# Patient Record
Sex: Female | Born: 1975 | Race: White | Hispanic: No | Marital: Married | Smoking: Never smoker
Health system: Southern US, Community
[De-identification: ages and names within clinical notes are randomized; demographics above are authoritative.]

## PROBLEM LIST (undated history)

## (undated) DIAGNOSIS — K219 Gastro-esophageal reflux disease without esophagitis: Secondary | ICD-10-CM

## (undated) DIAGNOSIS — R55 Syncope and collapse: Secondary | ICD-10-CM

## (undated) DIAGNOSIS — I499 Cardiac arrhythmia, unspecified: Secondary | ICD-10-CM

---

## 2000-02-20 ENCOUNTER — Other Ambulatory Visit: Admission: RE | Admit: 2000-02-20 | Discharge: 2000-02-20 | Payer: Self-pay | Admitting: Obstetrics and Gynecology

## 2001-04-02 ENCOUNTER — Ambulatory Visit (HOSPITAL_COMMUNITY): Admission: RE | Admit: 2001-04-02 | Discharge: 2001-04-02 | Payer: Self-pay | Admitting: Cardiology

## 2001-04-02 ENCOUNTER — Encounter: Payer: Self-pay | Admitting: Cardiology

## 2001-04-14 ENCOUNTER — Ambulatory Visit (HOSPITAL_COMMUNITY): Admission: RE | Admit: 2001-04-14 | Discharge: 2001-04-14 | Payer: Self-pay | Admitting: Cardiology

## 2004-12-16 ENCOUNTER — Other Ambulatory Visit: Admission: RE | Admit: 2004-12-16 | Discharge: 2004-12-16 | Payer: Self-pay | Admitting: Obstetrics and Gynecology

## 2005-12-29 ENCOUNTER — Other Ambulatory Visit: Admission: RE | Admit: 2005-12-29 | Discharge: 2005-12-29 | Payer: Self-pay | Admitting: Obstetrics and Gynecology

## 2007-01-05 ENCOUNTER — Other Ambulatory Visit: Admission: RE | Admit: 2007-01-05 | Discharge: 2007-01-05 | Payer: Self-pay | Admitting: Obstetrics and Gynecology

## 2007-03-16 ENCOUNTER — Other Ambulatory Visit: Admission: RE | Admit: 2007-03-16 | Discharge: 2007-03-16 | Payer: Self-pay | Admitting: Obstetrics and Gynecology

## 2007-06-15 ENCOUNTER — Other Ambulatory Visit: Admission: RE | Admit: 2007-06-15 | Discharge: 2007-06-15 | Payer: Self-pay | Admitting: Obstetrics and Gynecology

## 2007-09-14 ENCOUNTER — Other Ambulatory Visit: Admission: RE | Admit: 2007-09-14 | Discharge: 2007-09-14 | Payer: Self-pay | Admitting: Obstetrics and Gynecology

## 2008-12-08 DIAGNOSIS — R55 Syncope and collapse: Secondary | ICD-10-CM

## 2008-12-08 HISTORY — DX: Syncope and collapse: R55

## 2009-02-16 ENCOUNTER — Encounter (INDEPENDENT_AMBULATORY_CARE_PROVIDER_SITE_OTHER): Payer: Self-pay | Admitting: Emergency Medicine

## 2009-02-16 ENCOUNTER — Emergency Department (HOSPITAL_COMMUNITY): Admission: EM | Admit: 2009-02-16 | Discharge: 2009-02-16 | Payer: Self-pay | Admitting: *Deleted

## 2009-02-16 ENCOUNTER — Ambulatory Visit: Payer: Self-pay | Admitting: Surgery

## 2009-02-21 ENCOUNTER — Ambulatory Visit: Payer: Self-pay | Admitting: Internal Medicine

## 2009-02-21 DIAGNOSIS — R0789 Other chest pain: Secondary | ICD-10-CM | POA: Insufficient documentation

## 2009-03-22 ENCOUNTER — Encounter: Admission: RE | Admit: 2009-03-22 | Discharge: 2009-03-22 | Payer: Self-pay | Admitting: Obstetrics and Gynecology

## 2009-05-23 ENCOUNTER — Inpatient Hospital Stay (HOSPITAL_COMMUNITY): Admission: AD | Admit: 2009-05-23 | Discharge: 2009-05-23 | Payer: Self-pay | Admitting: Obstetrics and Gynecology

## 2009-05-23 ENCOUNTER — Inpatient Hospital Stay (HOSPITAL_COMMUNITY): Admission: AD | Admit: 2009-05-23 | Discharge: 2009-05-24 | Payer: Self-pay | Admitting: Obstetrics and Gynecology

## 2009-05-24 ENCOUNTER — Inpatient Hospital Stay (HOSPITAL_COMMUNITY): Admission: AD | Admit: 2009-05-24 | Discharge: 2009-05-28 | Payer: Self-pay | Admitting: Obstetrics and Gynecology

## 2009-05-25 ENCOUNTER — Encounter (INDEPENDENT_AMBULATORY_CARE_PROVIDER_SITE_OTHER): Payer: Self-pay | Admitting: Obstetrics and Gynecology

## 2009-10-29 ENCOUNTER — Encounter: Admission: RE | Admit: 2009-10-29 | Discharge: 2009-10-29 | Payer: Self-pay | Admitting: Obstetrics and Gynecology

## 2010-01-25 IMAGING — CR DG CHEST 2V
2 series · 2 of 2 positions shown · non-contrast
Comparison: None

CLINICAL DATA: Shortness of breath

CHEST - 2 VIEW
The patient is 24 weeks pregnant.  The patient abdomen was double
shielded during examination.

[w chest pa]
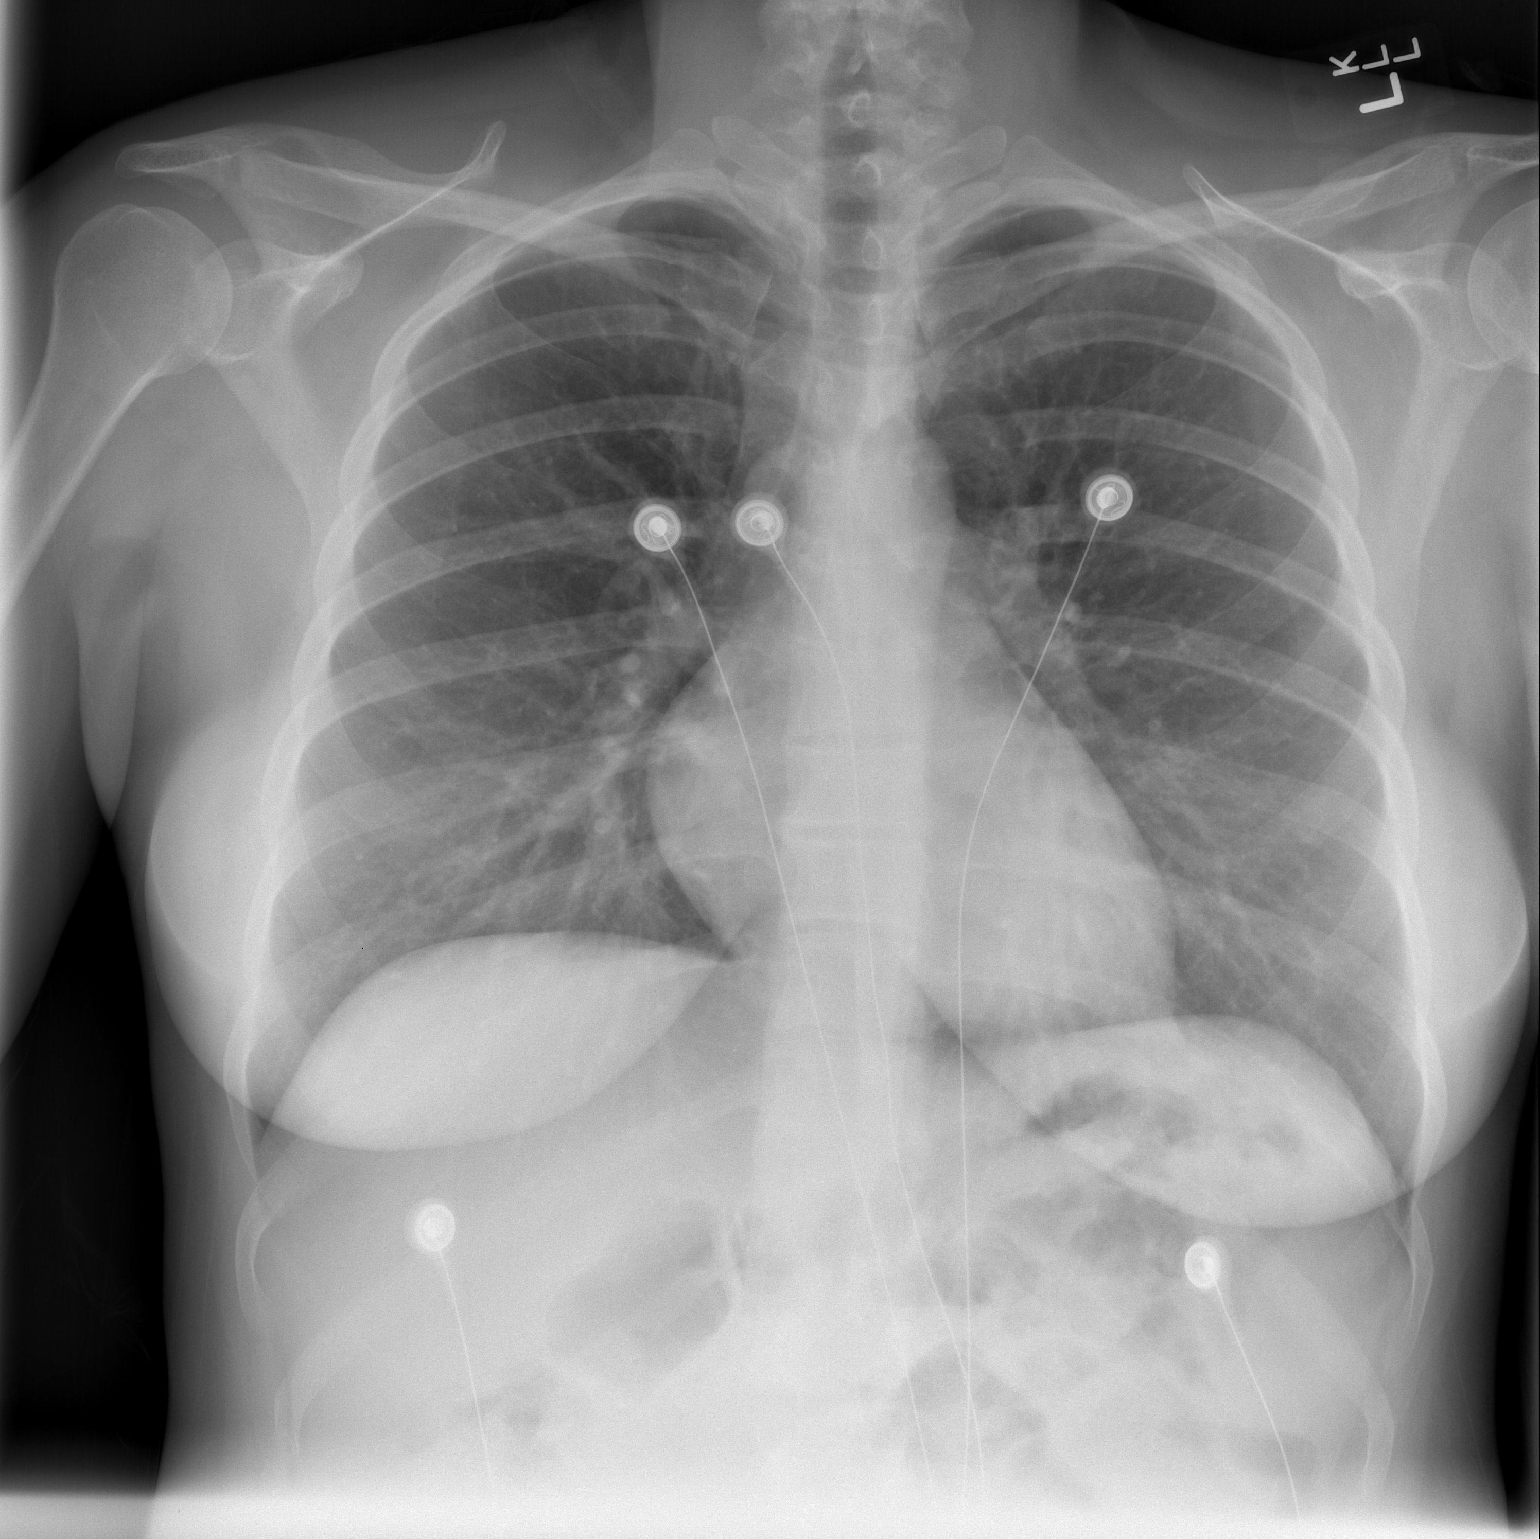

[w chest lat]
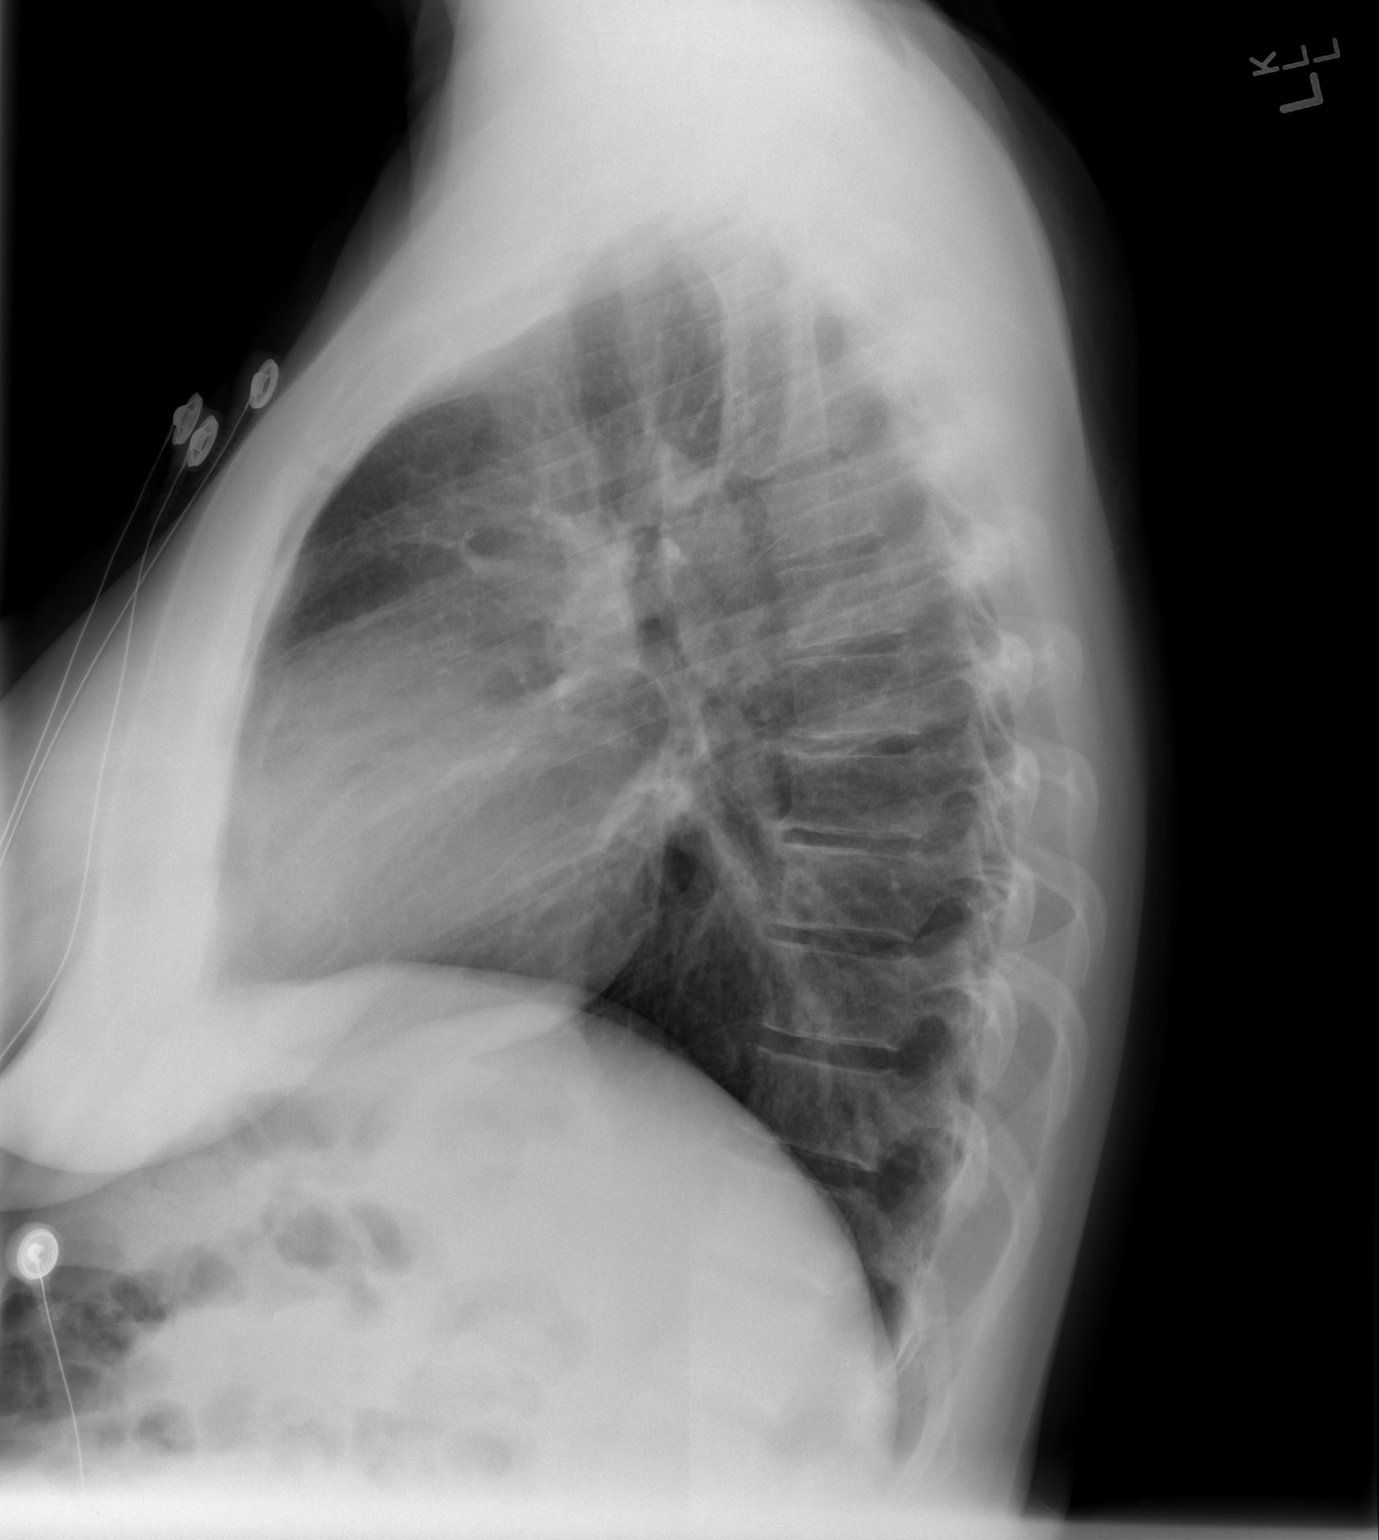

[2 of 2 positions shown; findings below may reference images not displayed]

FINDINGS: Cardiomediastinal silhouette is unremarkable.  No acute
infiltrate or pleural effusion.  No pulmonary edema.  Bony thorax
is unremarkable. No pneumothorax.
IMPRESSION: No active disease.

## 2010-03-27 ENCOUNTER — Encounter: Admission: RE | Admit: 2010-03-27 | Discharge: 2010-03-27 | Payer: Self-pay | Admitting: Obstetrics and Gynecology

## 2011-01-01 ENCOUNTER — Other Ambulatory Visit: Payer: Self-pay | Admitting: Obstetrics and Gynecology

## 2011-02-26 ENCOUNTER — Other Ambulatory Visit: Payer: Self-pay | Admitting: Obstetrics and Gynecology

## 2011-02-26 DIAGNOSIS — Z09 Encounter for follow-up examination after completed treatment for conditions other than malignant neoplasm: Secondary | ICD-10-CM

## 2011-03-17 LAB — CBC
HCT: 29.5 % — ABNORMAL LOW (ref 36.0–46.0)
HCT: 38.3 % (ref 36.0–46.0)
Hemoglobin: 13.2 g/dL (ref 12.0–15.0)
MCHC: 34.5 g/dL (ref 30.0–36.0)
MCHC: 34.6 g/dL (ref 30.0–36.0)
MCV: 87.5 fL (ref 78.0–100.0)
MCV: 88.3 fL (ref 78.0–100.0)
Platelets: 149 10*3/uL — ABNORMAL LOW (ref 150–400)
Platelets: 156 10*3/uL (ref 150–400)
RDW: 14.7 % (ref 11.5–15.5)
RDW: 15.1 % (ref 11.5–15.5)
RDW: 15.1 % (ref 11.5–15.5)
WBC: 9.8 10*3/uL (ref 4.0–10.5)

## 2011-03-17 LAB — CREATININE, SERUM: GFR calc Af Amer: >60 mL/min (ref >60)

## 2011-03-20 LAB — CBC
MCV: 86.7 fL (ref 78.0–100.0)
Platelets: 263 10*3/uL (ref 150–400)
RDW: 13.6 % (ref 11.5–15.5)
WBC: 8.7 10*3/uL (ref 4.0–10.5)

## 2011-03-20 LAB — DIFFERENTIAL
Basophils Absolute: 0.1 10*3/uL (ref 0.0–0.1)
Eosinophils Absolute: 0.1 10*3/uL (ref 0.0–0.7)
Lymphocytes Relative: 14 % (ref 12–46)
Lymphs Abs: 1.2 10*3/uL (ref 0.7–4.0)
Neutrophils Relative %: 79 % — ABNORMAL HIGH (ref 43–77)

## 2011-03-20 LAB — BASIC METABOLIC PANEL
BUN: 6 mg/dL (ref 6–23)
Creatinine, Ser: 0.53 mg/dL (ref 0.4–1.2)
GFR calc non Af Amer: >60 mL/min (ref >60)

## 2011-03-20 LAB — D-DIMER, QUANTITATIVE: D-Dimer, Quant: 0.73 ug/mL-FEU — ABNORMAL HIGH (ref 0.00–0.48)

## 2011-03-20 LAB — PROTIME-INR: Prothrombin Time: 13.2 seconds (ref 11.6–15.2)

## 2011-04-03 ENCOUNTER — Ambulatory Visit
Admission: RE | Admit: 2011-04-03 | Discharge: 2011-04-03 | Disposition: A | Discharge status: Home / Self Care | Payer: BC Managed Care – PPO | Source: Ambulatory Visit | Attending: Obstetrics and Gynecology | Admitting: Obstetrics and Gynecology

## 2011-04-03 DIAGNOSIS — Z09 Encounter for follow-up examination after completed treatment for conditions other than malignant neoplasm: Secondary | ICD-10-CM

## 2011-04-22 NOTE — Op Note (Signed)
NAMESERENA, Olivia Fry          ACCOUNT NO.:  [id number]   MEDICAL RECORD NO.:  [id number]          PATIENT TYPE:  INP   LOCATION:  9112                          FACILITY:  [facility]   PHYSICIAN:  Carrington Clamp, M.D. DATE OF BIRTH:  11/05/1976   DATE OF PROCEDURE:  05/25/2009  DATE OF DISCHARGE:                               OPERATIVE REPORT   PREOPERATIVE DIAGNOSIS:  Arrest of active phase during term labor with  nonreassuring fetal heart tones, remote from vaginal delivery.   POSTOPERATIVE DIAGNOSIS:  Arrest of active phase during term labor with  nonreassuring fetal heart tones, remote from vaginal delivery.   PROCEDURE:  Primary low-transverse cesarean section.   SURGEON:  Carrington Clamp, MD   ASSISTANT:  None.   ANESTHESIA:  Epidural.   FINDINGS:  Female infant vertex presentation, Apgars 7 and 9.  Normal  tubes, ovaries and uterus were seen.  Baby's weight was 9 pounds and the  cord gas was 7.33.   SPECIMENS:  Placenta to Pathology.   BLOOD LOSS:  800 mL.   IV FLUIDS:  3000 mL.   URINE OUTPUT:  125 mL.   COMPLICATIONS:  None.   MEDICATIONS:  Ancef and Pitocin.   Counts were correct x3.   REASON FOR OPERATION:  The patient remained at 9 complete -2 for 2-1/2  hours despite Pitocin augmentation.  The patient had started with  chorioamnionitis earlier in the evening.  The labor had been protracted  before arrest at 9 cm.  Prior to the operation, fetal heart tones now  had mild-to-moderate variables with each contraction.  All risks,  benefits, and alternatives of the C-section had been discussed with the  patient.  The patient agreed to proceed.   TECHNIQUE:  After adequate epidural anesthesia was achieved, the patient  was prepped and draped in the usual sterile fashion in dorsal supine  position with leftward tilt.  Pfannenstiel skin incision was made with a  scalpel and carried down to the fascia with Bovie cautery.  Fascia was  incised in the midline with  the scalpel and carried in a transverse  curvilinear manner with a Mayo scissors.  The fascia was reflected  superiorly and inferiorly from the rectus muscle.  The rectus muscles  were split in the midline.  The bowel free portion of peritoneum was  entered into with bluntly and the peritoneum was incised in a superior  and inferior manner with good visualization of bowel and the bladder.   The Alexis instrument was placed in the vesicouterine fascia, tented up  and incised in a transverse curvilinear manner.  The bladder was  retracted inferiorly with the development of the bladder flap bluntly.  A 2-cm incision was made in the upper portion of the lower uterine  segment until clear fluid was noted.  The uterine incision was extended  in a transverse curvilinear manner.  The baby was identified in the  vertex presentation, delivered without complication.  The cords were  clamped and cut, and the baby was handed to the awaiting neonatologist.   The placenta was delivered manually and the uterus was cleared of all  debris with wet laps.  The uterine incision was closed with a running  lock stitch of 0 Monocryl.  An imbricating layer of 0 Monocryl was  performed as well.  Once hemostasis was achieved, irrigation was  performed and all instruments were withdrawn from the abdomen after the  ovaries and tubes have been noted to be normal.   The peritoneum was then closed with a running stitch of 2-0 Vicryl.  This incorporated the rectus muscles as a separate layer.  The fascia  was closed with a running stitch of 0 Vicryl.  Subcutaneous tissue was  rendered hemostatic with Bovie cautery irrigation.  The subcutaneous  tissue was closed with interrupted stitches of 2-0 plain gut.  The skin  was closed with staples.  The patient tolerated the procedure well and  was returned to recovery room in stable condition.      Carrington Clamp, M.D.  Electronically Signed     MH/MEDQ  D:   05/25/2009  T:  05/25/2009  Job:  [id number]

## 2011-04-25 NOTE — Discharge Summary (Signed)
Olivia Fry, BLASSINGAME          ACCOUNT NO.:  [id number]   MEDICAL RECORD NO.:  [id number]          PATIENT TYPE:  INP   LOCATION:  9112                          FACILITY:  [facility]   PHYSICIAN:  Kendra H. Tenny Craw, MD     DATE OF BIRTH:  10-Jan-1976   DATE OF ADMISSION:  05/24/2009  DATE OF DISCHARGE:  05/28/2009                               DISCHARGE SUMMARY   FINAL DIAGNOSES:  Intrauterine pregnancy at 37-5/[redacted] weeks gestation,  active labor, arrest of the active phase of labor, nonreassuring fetal  heart tones, remote from vaginal delivery.   PROCEDURE:  Primary low-transverse cesarean section.   SURGEON:  Carrington Clamp, MD   COMPLICATIONS:  None.   This 35 year old, G1, P0, presents at 37-5/[redacted] weeks gestation in active  labor.  The patient's antepartum course up to this point had been  uncomplicated.  The patient did have some supraventricular tachycardia  noted during this pregnancy.  The patient also had some polyhydramnios  noted.  The patient's antepartum testing have all been reassuring.  However, she had a negative group B strep culture obtained in 35 weeks.  The patient dilated to about 9 cm of dilation.  The patient started to  develop a temperature early in the evening.  She did not dilate past 9  cm about a -2 station despite 2-1/2 hours of Pitocin augmentation.  Because of this arrest during the active phase of labor and some fetal  heart tone changes with these contractions, discussion was held with the  patient.  Decision was made to proceed with the cesarean section.  The  patient was taken to the operating room on May 25, 2009 by Dr. Carrington Clamp, where a primary low-transverse cesarean section was performed  with the delivery of a 9 pound female infant with Apgars of 7 and 9.  There was a cord pH of 7.33.  Delivery went without complications.  The  patient's postoperative course was benign without any significant fever.  She did want her little boy circumcised  prior to discharge which was  performed.  The patient was felt ready for discharge by postoperative  day #3.  She was sent home on a regular diet, told to decrease  activities, told to continue her prenatal vitamins, was given a  prescription for pain medication to use as needed, the name of the  medication was not in the chart.  She is to follow up in our office in 4-  6 weeks.  Instructions and precautions were reviewed with the patient.   LABORATORY ON DISCHARGE:  The patient had a hemoglobin of 10.1, white  blood cell count of 7.3, and platelets of 156,000.      [name], P.A.-C.      Freddrick March. Tenny Craw, MD  Electronically Signed   MB/MEDQ  D:  06/08/2009  T:  06/09/2009  Job:  [id number]

## 2011-11-14 ENCOUNTER — Other Ambulatory Visit: Payer: Self-pay | Admitting: Obstetrics and Gynecology

## 2011-12-12 LAB — OB RESULTS CONSOLE ANTIBODY SCREEN: Antibody Screen: NEGATIVE

## 2011-12-12 LAB — OB RESULTS CONSOLE ABO/RH: RH Type: POSITIVE

## 2011-12-12 LAB — OB RESULTS CONSOLE RPR: RPR: NONREACTIVE

## 2011-12-12 LAB — OB RESULTS CONSOLE RUBELLA ANTIBODY, IGM: Rubella: IMMUNE

## 2011-12-12 LAB — OB RESULTS CONSOLE GC/CHLAMYDIA: Gonorrhea: NEGATIVE

## 2012-03-11 IMAGING — US US BREAST R
1 series · 4 of 4 positions shown · non-contrast
Comparison: 03/22/2009, 10/29/2009 and 03/27/2010

CLINICAL DATA: Patient presents for a diagnostic right breast
ultrasound as follow-up of a known mass at the 7 o'clock position.
This mass is palpable as the patient states it has not changed over
the past 2 years.

RIGHT BREAST ULTRASOUND

[Series 1: us breast right · 4 of 4 slices shown]
[im 1/4]
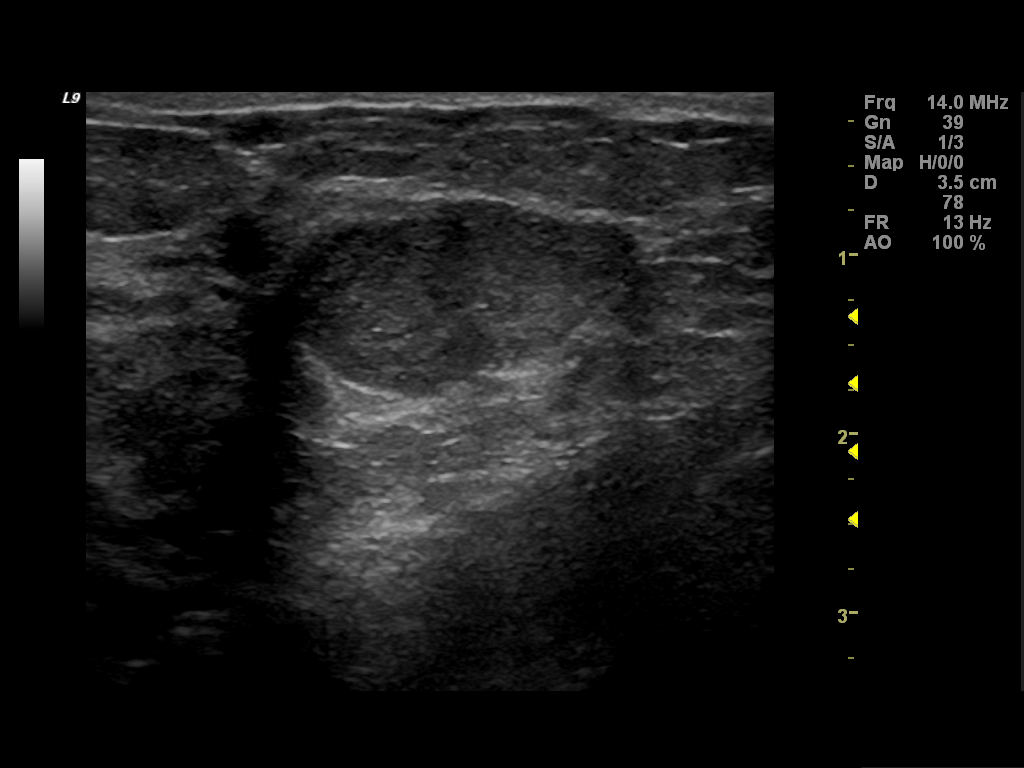
[im 2/4]
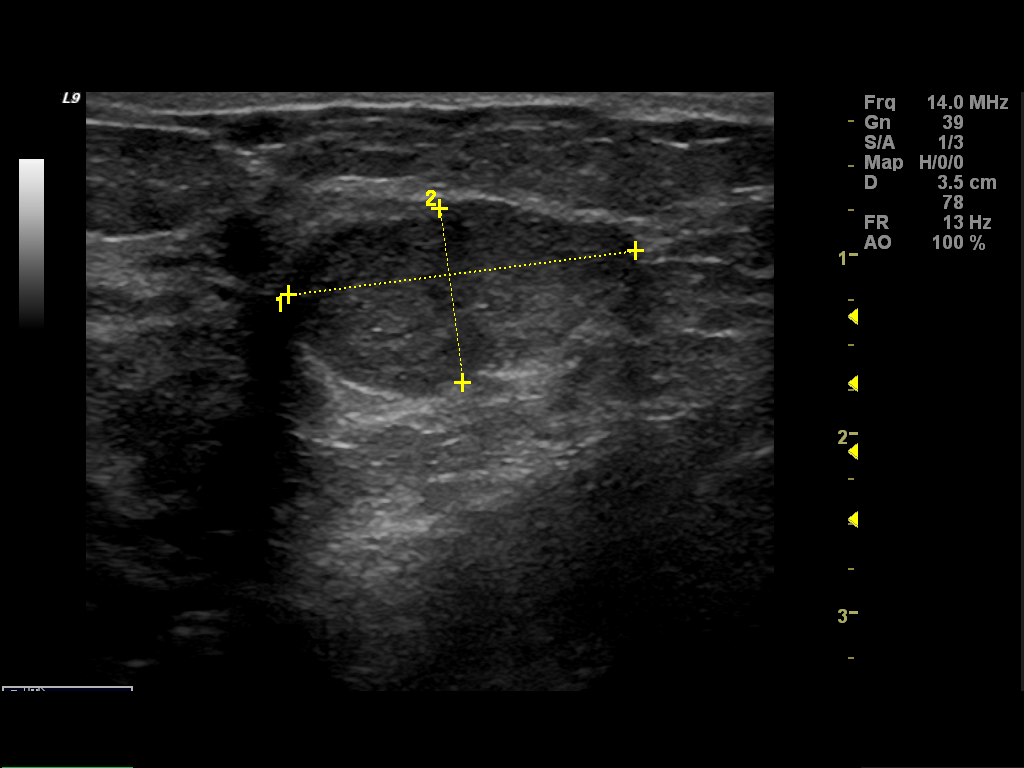
[im 3/4]
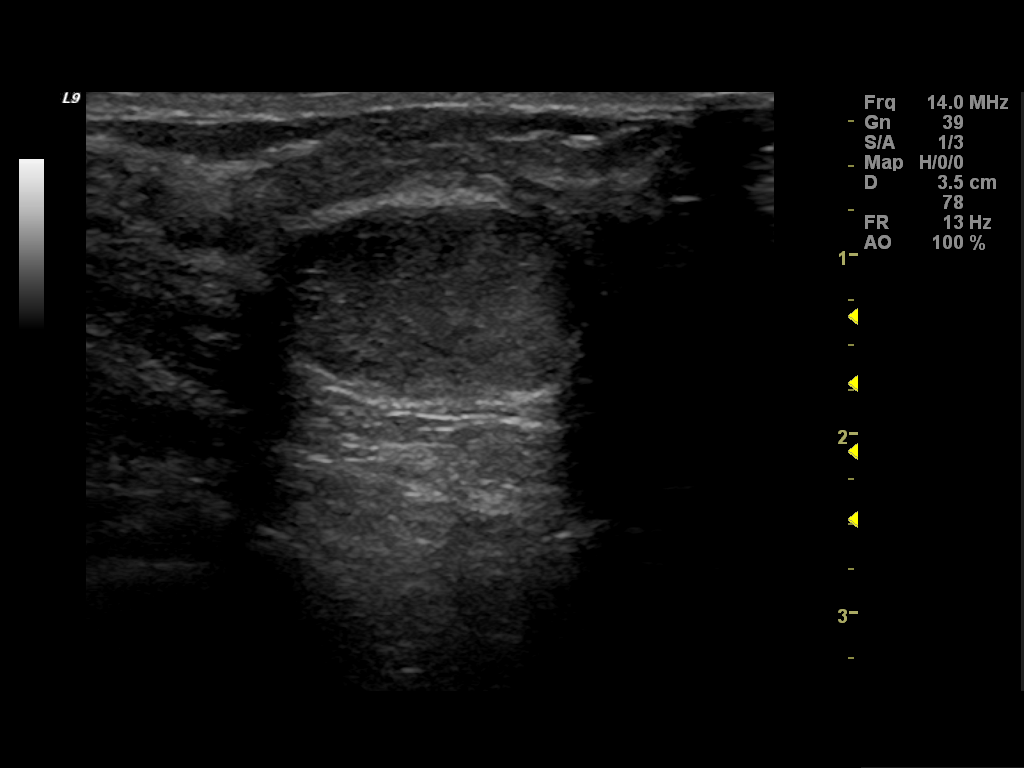
[im 4/4]
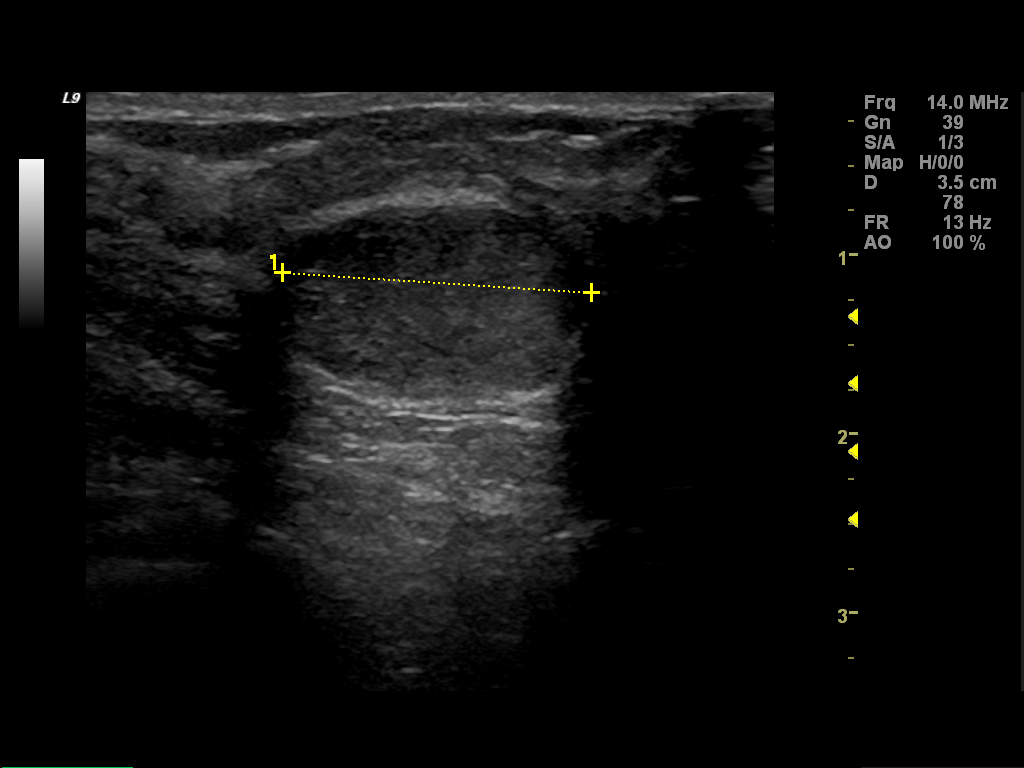

[4 of 4 positions shown; findings below may reference images not displayed]

FINDINGS: Ultrasound is performed, showing evidence of patient's
known well-defined ovoid hypoechoic mass at the 7 o'clock position
of the right breast 4 cm from the nipple.  This measures 1 x 1.7 x
2 cm and is slightly smaller compared to the prior exams.  This
likely represents a fibroadenoma.
IMPRESSION: Stable 2 cm mass at the 7 o'clock position of the right breast 4 cm
from the nipple likely a fibroadenoma.  This has been stable for 2
years.

Recommendations:  Recommend beginning annual screening mammographic
follow-up at age 42.

BI-RADS CATEGORY 2:  Benign finding(s).

## 2012-05-11 ENCOUNTER — Other Ambulatory Visit (HOSPITAL_COMMUNITY): Payer: Self-pay | Admitting: Obstetrics and Gynecology

## 2012-05-11 DIAGNOSIS — O26849 Uterine size-date discrepancy, unspecified trimester: Secondary | ICD-10-CM

## 2012-05-20 ENCOUNTER — Encounter (HOSPITAL_COMMUNITY): Payer: Self-pay

## 2012-05-20 ENCOUNTER — Ambulatory Visit (HOSPITAL_COMMUNITY)
Admission: RE | Admit: 2012-05-20 | Discharge: 2012-05-20 | Disposition: A | Discharge status: Home / Self Care | Payer: BC Managed Care – PPO | Source: Ambulatory Visit | Attending: Obstetrics and Gynecology | Admitting: Obstetrics and Gynecology

## 2012-05-20 DIAGNOSIS — Z3689 Encounter for other specified antenatal screening: Secondary | ICD-10-CM | POA: Insufficient documentation

## 2012-05-20 DIAGNOSIS — O34219 Maternal care for unspecified type scar from previous cesarean delivery: Secondary | ICD-10-CM | POA: Insufficient documentation

## 2012-05-20 DIAGNOSIS — O3660X0 Maternal care for excessive fetal growth, unspecified trimester, not applicable or unspecified: Secondary | ICD-10-CM | POA: Insufficient documentation

## 2012-05-20 DIAGNOSIS — O26849 Uterine size-date discrepancy, unspecified trimester: Secondary | ICD-10-CM

## 2012-05-21 LAB — OB RESULTS CONSOLE GBS: GBS: NEGATIVE

## 2012-06-02 ENCOUNTER — Encounter (HOSPITAL_COMMUNITY): Payer: Self-pay | Admitting: Pharmacy Technician

## 2012-06-08 ENCOUNTER — Encounter (HOSPITAL_COMMUNITY): Payer: Self-pay

## 2012-06-09 ENCOUNTER — Encounter (HOSPITAL_COMMUNITY): Payer: Self-pay

## 2012-06-09 ENCOUNTER — Encounter (HOSPITAL_COMMUNITY)
Admission: RE | Admit: 2012-06-09 | Discharge: 2012-06-09 | Disposition: A | Discharge status: Home / Self Care | Payer: BC Managed Care – PPO | Source: Ambulatory Visit | Attending: Obstetrics and Gynecology | Admitting: Obstetrics and Gynecology

## 2012-06-09 HISTORY — DX: Cardiac arrhythmia, unspecified: I49.9

## 2012-06-09 HISTORY — DX: Syncope and collapse: R55

## 2012-06-09 HISTORY — DX: Gastro-esophageal reflux disease without esophagitis: K21.9

## 2012-06-09 LAB — TYPE AND SCREEN: Antibody Screen: NEGATIVE

## 2012-06-09 LAB — ABO/RH: ABO/RH(D): O POS

## 2012-06-09 LAB — CBC
HCT: 36.4 % (ref 36.0–46.0)
Hemoglobin: 12.4 g/dL (ref 12.0–15.0)
MCHC: 34.1 g/dL (ref 30.0–36.0)
RBC: 4.33 MIL/uL (ref 3.87–5.11)

## 2012-06-09 NOTE — Pre-Procedure Instructions (Signed)
Dr. F.Jackson notified via email of pt being high risk for PPH. 

## 2012-06-09 NOTE — Patient Instructions (Addendum)
YOUR PROCEDURE IS SCHEDULED ON:06/17/12  ENTER THROUGH THE MAIN ENTRANCE OF [facility] AT:6am  USE DESK PHONE AND DIAL [id number] TO INFORM us OF YOUR ARRIVAL  CALL [phone] IF YOU HAVE ANY QUESTIONS OR PROBLEMS PRIOR TO YOUR ARRIVAL.  REMEMBER: DO NOT EAT OR DRINK AFTER MIDNIGHT : Wed   SPECIAL INSTRUCTIONS:none   YOU MAY BRUSH YOUR TEETH THE MORNING OF SURGERY   TAKE THESE MEDICINES THE DAY OF SURGERY WITH SIP OF WATER: Zantac   DO NOT WEAR JEWELRY, EYE MAKEUP, LIPSTICK OR DARK FINGERNAIL POLISH DO NOT WEAR LOTIONS  DO NOT SHAVE FOR 48 HOURS PRIOR TO SURGERY

## 2012-06-16 ENCOUNTER — Encounter (HOSPITAL_COMMUNITY): Payer: Self-pay | Admitting: Anesthesiology

## 2012-06-16 ENCOUNTER — Encounter (HOSPITAL_COMMUNITY)
Admission: AD | Disposition: A | Discharge status: Home / Self Care | Payer: Self-pay | Source: Ambulatory Visit | Attending: Obstetrics & Gynecology

## 2012-06-16 ENCOUNTER — Encounter (HOSPITAL_COMMUNITY): Payer: Self-pay | Admitting: *Deleted

## 2012-06-16 ENCOUNTER — Inpatient Hospital Stay (HOSPITAL_COMMUNITY)
Admission: AD | Admit: 2012-06-16 | Discharge: 2012-06-18 | DRG: 371 | Disposition: A | Discharge status: Home / Self Care | Payer: BC Managed Care – PPO | Source: Ambulatory Visit | Attending: Obstetrics & Gynecology | Admitting: Obstetrics & Gynecology

## 2012-06-16 ENCOUNTER — Inpatient Hospital Stay (HOSPITAL_COMMUNITY): Payer: BC Managed Care – PPO | Admitting: Anesthesiology

## 2012-06-16 DIAGNOSIS — O34219 Maternal care for unspecified type scar from previous cesarean delivery: Principal | ICD-10-CM | POA: Diagnosis present

## 2012-06-16 LAB — CBC
MCH: 28.7 pg (ref 26.0–34.0)
MCHC: 34.5 g/dL (ref 30.0–36.0)
MCV: 83.3 fL (ref 78.0–100.0)
Platelets: 285 10*3/uL (ref 150–400)
RDW: 13.6 % (ref 11.5–15.5)

## 2012-06-16 SURGERY — Surgical Case
Anesthesia: Spinal

## 2012-06-16 MED ORDER — MEPERIDINE HCL 25 MG/ML IJ SOLN: 6.2500 mg | INTRAMUSCULAR | Status: DC | PRN

## 2012-06-16 MED ORDER — SENNOSIDES-DOCUSATE SODIUM 8.6-50 MG PO TABS
2.0000 | ORAL_TABLET | Freq: Every day | ORAL | Status: DC
  Administered 2012-06-17: 2 via ORAL

## 2012-06-16 MED ORDER — ONDANSETRON HCL 4 MG PO TABS: 4.0000 mg | ORAL_TABLET | ORAL | Status: DC | PRN

## 2012-06-16 MED ORDER — DIBUCAINE 1 % RE OINT: 1.0000 "application " | TOPICAL_OINTMENT | RECTAL | Status: DC | PRN

## 2012-06-16 MED ORDER — CEFAZOLIN SODIUM 1-5 GM-% IV SOLN
INTRAVENOUS | Status: DC | PRN
  Administered 2012-06-16: 2 g via INTRAVENOUS

## 2012-06-16 MED ORDER — BUPIVACAINE IN DEXTROSE 0.75-8.25 % IT SOLN
INTRATHECAL | Status: DC | PRN
  Administered 2012-06-16: 13 mg via INTRATHECAL

## 2012-06-16 MED ORDER — NALBUPHINE SYRINGE 5 MG/0.5 ML
5.0000 mg | INJECTION | INTRAMUSCULAR | Status: DC | PRN
  Filled 2012-06-16: qty 1

## 2012-06-16 MED ORDER — IBUPROFEN 600 MG PO TABS
600.0000 mg | ORAL_TABLET | Freq: Four times a day (QID) | ORAL | Status: DC
  Administered 2012-06-17 – 2012-06-18 (×6): 600 mg via ORAL
  Filled 2012-06-16 (×6): qty 1

## 2012-06-16 MED ORDER — LACTATED RINGERS IV SOLN
INTRAVENOUS | Status: DC | PRN
  Administered 2012-06-16: 19:00:00 via INTRAVENOUS

## 2012-06-16 MED ORDER — ONDANSETRON HCL 4 MG/2ML IJ SOLN: 4.0000 mg | INTRAMUSCULAR | Status: DC | PRN

## 2012-06-16 MED ORDER — OXYTOCIN 10 UNIT/ML IJ SOLN
40.0000 [IU] | INTRAVENOUS | Status: DC | PRN
  Administered 2012-06-16: 40 [IU] via INTRAVENOUS

## 2012-06-16 MED ORDER — SODIUM CHLORIDE 0.9 % IJ SOLN: 3.0000 mL | INTRAMUSCULAR | Status: DC | PRN

## 2012-06-16 MED ORDER — ONDANSETRON HCL 4 MG/2ML IJ SOLN
INTRAMUSCULAR | Status: AC
  Filled 2012-06-16: qty 2

## 2012-06-16 MED ORDER — KETOROLAC TROMETHAMINE 30 MG/ML IJ SOLN: 30.0000 mg | Freq: Four times a day (QID) | INTRAMUSCULAR | Status: AC | PRN

## 2012-06-16 MED ORDER — PHENYLEPHRINE 40 MCG/ML (10ML) SYRINGE FOR IV PUSH (FOR BLOOD PRESSURE SUPPORT)
PREFILLED_SYRINGE | INTRAVENOUS | Status: AC
  Filled 2012-06-16: qty 5

## 2012-06-16 MED ORDER — FAMOTIDINE IN NACL 20-0.9 MG/50ML-% IV SOLN
20.0000 mg | Freq: Once | INTRAVENOUS | Status: AC
  Administered 2012-06-16: 20 mg via INTRAVENOUS
  Filled 2012-06-16: qty 50

## 2012-06-16 MED ORDER — PRENATAL MULTIVITAMIN CH
1.0000 | ORAL_TABLET | Freq: Every day | ORAL | Status: DC
  Administered 2012-06-17 – 2012-06-18 (×2): 1 via ORAL
  Filled 2012-06-16 (×2): qty 1

## 2012-06-16 MED ORDER — ONDANSETRON HCL 4 MG/2ML IJ SOLN
INTRAMUSCULAR | Status: DC | PRN
  Administered 2012-06-16: 4 mg via INTRAVENOUS

## 2012-06-16 MED ORDER — ONDANSETRON HCL 4 MG/2ML IJ SOLN: 4.0000 mg | Freq: Three times a day (TID) | INTRAMUSCULAR | Status: DC | PRN

## 2012-06-16 MED ORDER — DIPHENHYDRAMINE HCL 25 MG PO CAPS: 25.0000 mg | ORAL_CAPSULE | Freq: Four times a day (QID) | ORAL | Status: DC | PRN

## 2012-06-16 MED ORDER — DIPHENHYDRAMINE HCL 50 MG/ML IJ SOLN: 12.5000 mg | INTRAMUSCULAR | Status: DC | PRN

## 2012-06-16 MED ORDER — LANOLIN HYDROUS EX OINT: 1.0000 "application " | TOPICAL_OINTMENT | CUTANEOUS | Status: DC | PRN

## 2012-06-16 MED ORDER — OXYCODONE-ACETAMINOPHEN 5-325 MG PO TABS
1.0000 | ORAL_TABLET | ORAL | Status: DC | PRN
  Administered 2012-06-18: 1 via ORAL
  Administered 2012-06-18: 2 via ORAL
  Filled 2012-06-16: qty 1
  Filled 2012-06-16: qty 2

## 2012-06-16 MED ORDER — MENTHOL 3 MG MT LOZG: 1.0000 | LOZENGE | OROMUCOSAL | Status: DC | PRN

## 2012-06-16 MED ORDER — SIMETHICONE 80 MG PO CHEW
80.0000 mg | CHEWABLE_TABLET | Freq: Three times a day (TID) | ORAL | Status: DC
  Administered 2012-06-17 – 2012-06-18 (×4): 80 mg via ORAL

## 2012-06-16 MED ORDER — WITCH HAZEL-GLYCERIN EX PADS: 1.0000 "application " | MEDICATED_PAD | CUTANEOUS | Status: DC | PRN

## 2012-06-16 MED ORDER — METOCLOPRAMIDE HCL 5 MG/ML IJ SOLN: 10.0000 mg | Freq: Three times a day (TID) | INTRAMUSCULAR | Status: DC | PRN

## 2012-06-16 MED ORDER — BUTORPHANOL TARTRATE 2 MG/ML IJ SOLN
2.0000 mg | Freq: Once | INTRAMUSCULAR | Status: AC
  Administered 2012-06-16: 2 mg via INTRAVENOUS
  Filled 2012-06-16: qty 1

## 2012-06-16 MED ORDER — PROMETHAZINE HCL 25 MG/ML IJ SOLN: 6.2500 mg | INTRAMUSCULAR | Status: DC | PRN

## 2012-06-16 MED ORDER — LACTATED RINGERS IV SOLN
INTRAVENOUS | Status: DC
  Administered 2012-06-16 (×3): via INTRAVENOUS

## 2012-06-16 MED ORDER — PHENYLEPHRINE HCL 10 MG/ML IJ SOLN
INTRAMUSCULAR | Status: DC | PRN
  Administered 2012-06-16: 80 ug via INTRAVENOUS
  Administered 2012-06-16: 40 ug via INTRAVENOUS
  Administered 2012-06-16: 80 ug via INTRAVENOUS

## 2012-06-16 MED ORDER — FENTANYL CITRATE 0.05 MG/ML IJ SOLN
INTRAMUSCULAR | Status: DC | PRN
  Administered 2012-06-16: 25 ug via INTRATHECAL

## 2012-06-16 MED ORDER — DIPHENHYDRAMINE HCL 50 MG/ML IJ SOLN: 25.0000 mg | INTRAMUSCULAR | Status: DC | PRN

## 2012-06-16 MED ORDER — MORPHINE SULFATE 0.5 MG/ML IJ SOLN
INTRAMUSCULAR | Status: AC
  Filled 2012-06-16: qty 10

## 2012-06-16 MED ORDER — SODIUM CHLORIDE 0.9 % IV SOLN
1.0000 ug/kg/h | INTRAVENOUS | Status: DC | PRN
  Filled 2012-06-16: qty 2.5

## 2012-06-16 MED ORDER — ZOLPIDEM TARTRATE 5 MG PO TABS: 5.0000 mg | ORAL_TABLET | Freq: Every evening | ORAL | Status: DC | PRN

## 2012-06-16 MED ORDER — MIDAZOLAM HCL 2 MG/2ML IJ SOLN: 0.5000 mg | Freq: Once | INTRAMUSCULAR | Status: DC | PRN

## 2012-06-16 MED ORDER — CEFAZOLIN SODIUM-DEXTROSE 2-3 GM-% IV SOLR
INTRAVENOUS | Status: AC
  Filled 2012-06-16: qty 50

## 2012-06-16 MED ORDER — SCOPOLAMINE 1 MG/3DAYS TD PT72
1.0000 | MEDICATED_PATCH | Freq: Once | TRANSDERMAL | Status: DC
  Administered 2012-06-16: 1.5 mg via TRANSDERMAL

## 2012-06-16 MED ORDER — FENTANYL CITRATE 0.05 MG/ML IJ SOLN: 25.0000 ug | INTRAMUSCULAR | Status: DC | PRN

## 2012-06-16 MED ORDER — SCOPOLAMINE 1 MG/3DAYS TD PT72
MEDICATED_PATCH | TRANSDERMAL | Status: AC
  Filled 2012-06-16: qty 1

## 2012-06-16 MED ORDER — CITRIC ACID-SODIUM CITRATE 334-500 MG/5ML PO SOLN
30.0000 mL | Freq: Once | ORAL | Status: AC
  Administered 2012-06-16: 30 mL via ORAL
  Filled 2012-06-16: qty 15

## 2012-06-16 MED ORDER — NALOXONE HCL 0.4 MG/ML IJ SOLN: 0.4000 mg | INTRAMUSCULAR | Status: DC | PRN

## 2012-06-16 MED ORDER — OXYTOCIN 40 UNITS IN LACTATED RINGERS INFUSION - SIMPLE MED: 62.5000 mL/h | INTRAVENOUS | Status: AC

## 2012-06-16 MED ORDER — SIMETHICONE 80 MG PO CHEW: 80.0000 mg | CHEWABLE_TABLET | ORAL | Status: DC | PRN

## 2012-06-16 MED ORDER — MORPHINE SULFATE (PF) 0.5 MG/ML IJ SOLN
INTRAMUSCULAR | Status: DC | PRN
  Administered 2012-06-16: .5 ug via INTRATHECAL

## 2012-06-16 MED ORDER — FENTANYL CITRATE 0.05 MG/ML IJ SOLN
INTRAMUSCULAR | Status: AC
  Filled 2012-06-16: qty 2

## 2012-06-16 MED ORDER — CEFAZOLIN SODIUM-DEXTROSE 2-3 GM-% IV SOLR: 2.0000 g | Freq: Once | INTRAVENOUS | Status: DC

## 2012-06-16 MED ORDER — TETANUS-DIPHTH-ACELL PERTUSSIS 5-2.5-18.5 LF-MCG/0.5 IM SUSP
0.5000 mL | Freq: Once | INTRAMUSCULAR | Status: AC
  Administered 2012-06-18: 0.5 mL via INTRAMUSCULAR

## 2012-06-16 MED ORDER — ACETAMINOPHEN 325 MG PO TABS: 325.0000 mg | ORAL_TABLET | ORAL | Status: DC | PRN

## 2012-06-16 MED ORDER — DIPHENHYDRAMINE HCL 25 MG PO CAPS: 25.0000 mg | ORAL_CAPSULE | ORAL | Status: DC | PRN

## 2012-06-16 MED ORDER — KETOROLAC TROMETHAMINE 30 MG/ML IJ SOLN
30.0000 mg | Freq: Four times a day (QID) | INTRAMUSCULAR | Status: AC | PRN
  Administered 2012-06-16: 30 mg via INTRAVENOUS

## 2012-06-16 MED ORDER — LACTATED RINGERS IV SOLN
INTRAVENOUS | Status: DC
  Administered 2012-06-17: 08:00:00 via INTRAVENOUS

## 2012-06-16 MED ORDER — ACETAMINOPHEN 10 MG/ML IV SOLN
1000.0000 mg | Freq: Four times a day (QID) | INTRAVENOUS | Status: AC | PRN
  Filled 2012-06-16: qty 100

## 2012-06-16 MED ORDER — KETOROLAC TROMETHAMINE 30 MG/ML IJ SOLN
INTRAMUSCULAR | Status: AC
  Filled 2012-06-16: qty 1

## 2012-06-16 MED ORDER — OXYTOCIN 10 UNIT/ML IJ SOLN
INTRAMUSCULAR | Status: AC
  Filled 2012-06-16: qty 4

## 2012-06-16 SURGICAL SUPPLY — 30 items
CLOTH BEACON ORANGE TIMEOUT ST (SAFETY) ×2 IMPLANT
CONTAINER PREFILL 10% NBF 15ML (MISCELLANEOUS) IMPLANT
ELECT REM PT RETURN 9FT ADLT (ELECTROSURGICAL) ×2
ELECTRODE REM PT RTRN 9FT ADLT (ELECTROSURGICAL) ×1 IMPLANT
EXTRACTOR VACUUM M CUP 4 TUBE (SUCTIONS) ×1 IMPLANT
GLOVE ECLIPSE 6.0 STRL STRAW (GLOVE) ×2 IMPLANT
GLOVE ECLIPSE 6.5 STRL STRAW (GLOVE) ×2 IMPLANT
GOWN PREVENTION PLUS LG XLONG (DISPOSABLE) ×6 IMPLANT
KIT ABG SYR 3ML LUER SLIP (SYRINGE) IMPLANT
NDL HYPO 25X5/8 SAFETYGLIDE (NEEDLE) ×1 IMPLANT
NEEDLE HYPO 25X5/8 SAFETYGLIDE (NEEDLE) ×2 IMPLANT
NS IRRIG 1000ML POUR BTL (IV SOLUTION) ×2 IMPLANT
PACK C SECTION WH (CUSTOM PROCEDURE TRAY) ×2 IMPLANT
RTRCTR C-SECT PINK 25CM LRG (MISCELLANEOUS) ×1 IMPLANT
SLEEVE SCD COMPRESS KNEE MED (MISCELLANEOUS) IMPLANT
STAPLER VISISTAT 35W (STAPLE) IMPLANT
SUT PLAIN 0 NONE (SUTURE) IMPLANT
SUT VIC AB 0 CT1 27 (SUTURE) ×6
SUT VIC AB 0 CT1 27XBRD ANBCTR (SUTURE) ×3 IMPLANT
SUT VIC AB 1 CTX 36 (SUTURE) ×4
SUT VIC AB 1 CTX36XBRD ANBCTRL (SUTURE) ×2 IMPLANT
SUT VIC AB 3-0 CT1 27 (SUTURE) ×2
SUT VIC AB 3-0 CT1 TAPERPNT 27 (SUTURE) ×1 IMPLANT
SUT VIC AB 3-0 PS2 18 (SUTURE)
SUT VIC AB 3-0 PS2 18XBRD (SUTURE) IMPLANT
SUT VIC AB 3-0 SH 27 (SUTURE)
SUT VIC AB 3-0 SH 27X BRD (SUTURE) IMPLANT
TOWEL OR 17X24 6PK STRL BLUE (TOWEL DISPOSABLE) ×4 IMPLANT
TRAY FOLEY CATH 14FR (SET/KITS/TRAYS/PACK) ×2 IMPLANT
WATER STERILE IRR 1000ML POUR (IV SOLUTION) ×2 IMPLANT

## 2012-06-16 NOTE — Op Note (Addendum)
Patient Name: Olivia Fry MRN: [id number]  Date of Surgery: 06/16/2012    PREOPERATIVE DIAGNOSIS: Previous Cesarean Section; in Labor  POSTOPERATIVE DIAGNOSIS: Previous Cesarean Section; in Labor   PROCEDURE: Low transverse cesarean section  SURGEON: Caralyn Guile. Arlyce Dice M.D.  ANESTHESIA: Spinal  ESTIMATED BLOOD LOSS: 800 ml  FINDINGS: Female, 9 lbs 2 oz, Apgar 8,9: clear amniotic fluid, normal adnexa and uterus.   INDICATIONS: This is a 36 y.o.  [place] 1001 who is admitted in active labor with history of previous cesarean delivery.  Patient requested repeat C/S.  PROCEDURE IN DETAIL: The patient was taken to the operating room and spinal anesthesia was placed.  She was then placed in the supine position with left lateral displacement of the uterus. The abdomen was prepped and draped in a sterile fashion and the bladder was catheterized.  A low transverse abdominal incision was made through the previous laparotomy scar and carried down to the fascia. The fascia was opened transversely and the rectus sheath was dissected from the underlying rectus muscle. The rectus midline was identified and opened by sharp and blunt dissection. The peritoneum was opened.  The lower uterine segment was identified, entered transversely by careful sharp dissection, and extended bluntly.  The infant was delivered with the aid of a vacuum extractor. The placenta was delivered with cord traction. The uterus was bluntly curettage. The lower segment was closed with running interlocking Vicryl 1 suture.  A second imbricating Vicryl 1 suture line was placed. The peritoneum and rectus muscle were closed in the midline with running 3-0 Vicryl suture. The fascia was closed with running 0 Vicryl suture and the skin was closed with staples. All sponge and instrument counts were correct.  The patient tolerated the procedure well and left the operating room in good condition.

## 2012-06-16 NOTE — Transfer of Care (Signed)
Immediate Anesthesia Transfer of Care Note  Patient: Olivia Fry  Procedure(s) Performed: Procedure(s) (LRB): CESAREAN SECTION (N/A)  Patient Location: PACU  Anesthesia Type: Spinal  Level of Consciousness: awake  Airway & Oxygen Therapy: Patient Spontanous Breathing  Post-op Assessment: Report given to PACU RN  Post vital signs: Reviewed and stable  Complications: No apparent anesthesia complications

## 2012-06-16 NOTE — Anesthesia Preprocedure Evaluation (Signed)
Anesthesia Evaluation  Patient identified by MRN, date of birth, ID band Patient awake    Reviewed: Allergy & Precautions, H&P , NPO status , Patient's Chart, lab work & pertinent test results  Airway Mallampati: II TM Distance: >3 FB Neck ROM: Full    Dental No notable dental hx. (+) Teeth Intact   Pulmonary neg pulmonary ROS,  breath sounds clear to auscultation  Pulmonary exam normal       Cardiovascular negative cardio ROS  + dysrhythmias Supra Ventricular Tachycardia Rhythm:Regular Rate:Normal     Neuro/Psych negative neurological ROS  negative psych ROS   GI/Hepatic Neg liver ROS, GERD-  Medicated and Controlled,  Endo/Other  negative endocrine ROS  Renal/GU negative Renal ROS  negative genitourinary   Musculoskeletal   Abdominal Normal abdominal exam  (+)   Peds  Hematology negative hematology ROS (+)   Anesthesia Other Findings   Reproductive/Obstetrics (+) Pregnancy Previous C/Section in Labor                          Anesthesia Physical Anesthesia Plan  ASA: II and Emergent  Anesthesia Plan: Spinal   Post-op Pain Management:    Induction:   Airway Management Planned:   Additional Equipment:   Intra-op Plan:   Post-operative Plan:   Informed Consent: I have reviewed the patients History and Physical, chart, labs and discussed the procedure including the risks, benefits and alternatives for the proposed anesthesia with the patient or authorized representative who has indicated his/her understanding and acceptance.     Plan Discussed with: Anesthesiologist, Surgeon and CRNA  Anesthesia Plan Comments:        Anesthesia Quick Evaluation  

## 2012-06-16 NOTE — Anesthesia Procedure Notes (Signed)
Spinal  Patient location during procedure: OR Start time: 06/16/2012 6:20 PM Staffing Anesthesiologist: Katricia Prehn A. Performed by: anesthesiologist  Preanesthetic Checklist Completed: patient identified, site marked, surgical consent, pre-op evaluation, timeout performed, IV checked, risks and benefits discussed and monitors and equipment checked Spinal Block Patient position: sitting Prep: site prepped and draped and DuraPrep Patient monitoring: heart rate, cardiac monitor, continuous pulse ox and blood pressure Approach: midline Location: L3-4 Injection technique: single-shot Needle Needle type: Sprotte  Needle gauge: 24 G Needle length: 9 cm Assessment Sensory level: T4 Additional Notes Patient tolerated procedure well. Adequate sensory level.

## 2012-06-16 NOTE — MAU Note (Signed)
Patient reports having stronger contractions since 1500.  Seen in doctors office today.  Patient reported that her cervix was closed then. Denies bleeding or leaking of fluid.

## 2012-06-16 NOTE — H&P (Signed)
36 y.o. G2P1001  Estimated Date of Delivery: 06/23/12 admitted at [redacted] weeks gestation in active labor.  Prenatal Transfer Tool  Maternal Diabetes: No Genetic Screening: Declined Maternal Ultrasounds/Referrals: Normal fetal screen.  Ultrasound at 35 weeks showed EFW > 90th %ile. Fetal Ultrasounds or other Referrals:  None Maternal Substance Abuse:  No Significant Maternal Medications:  None Significant Maternal Lab Results: None Other Significant Pregnancy Complications:  Previous cesarean delivery.  Patient desires repeat cesarean for delivery.  Afebrile, VSS Heart and Lungs: No active disease Abdomen: soft, gravid, EFW LGA . Cervical exam:  4/70 (was closed in office this morning.  Impression: Previous cesarean delivery.  Patient in active labor - requests repeat C/S.  Plan:  Repeat C/S

## 2012-06-16 NOTE — Anesthesia Preprocedure Evaluation (Deleted)
Anesthesia Evaluation  Patient identified by MRN, date of birth, ID band Patient awake    Reviewed: Allergy & Precautions, H&P , NPO status , Patient's Chart, lab work & pertinent test results  Airway Mallampati: II TM Distance: >3 FB Neck ROM: Full    Dental No notable dental hx. (+) Teeth Intact   Pulmonary neg pulmonary ROS,  breath sounds clear to auscultation  Pulmonary exam normal       Cardiovascular negative cardio ROS  + dysrhythmias Supra Ventricular Tachycardia Rhythm:Regular Rate:Normal     Neuro/Psych negative neurological ROS  negative psych ROS   GI/Hepatic Neg liver ROS, GERD-  Medicated and Controlled,  Endo/Other  negative endocrine ROS  Renal/GU negative Renal ROS  negative genitourinary   Musculoskeletal   Abdominal Normal abdominal exam  (+)   Peds  Hematology negative hematology ROS (+)   Anesthesia Other Findings   Reproductive/Obstetrics (+) Pregnancy Previous C/Section in Labor                          Anesthesia Physical Anesthesia Plan  ASA: II and Emergent  Anesthesia Plan: Spinal   Post-op Pain Management:    Induction:   Airway Management Planned:   Additional Equipment:   Intra-op Plan:   Post-operative Plan:   Informed Consent: I have reviewed the patients History and Physical, chart, labs and discussed the procedure including the risks, benefits and alternatives for the proposed anesthesia with the patient or authorized representative who has indicated his/her understanding and acceptance.     Plan Discussed with: Anesthesiologist, Surgeon and CRNA  Anesthesia Plan Comments:        Anesthesia Quick Evaluation

## 2012-06-17 ENCOUNTER — Inpatient Hospital Stay (HOSPITAL_COMMUNITY)
Admission: RE | Admit: 2012-06-17 | Payer: BC Managed Care – PPO | Source: Ambulatory Visit | Admitting: Obstetrics and Gynecology

## 2012-06-17 ENCOUNTER — Encounter (HOSPITAL_COMMUNITY): Payer: Self-pay | Admitting: Obstetrics & Gynecology

## 2012-06-17 LAB — CBC
MCH: 28.5 pg (ref 26.0–34.0)
MCHC: 33.7 g/dL (ref 30.0–36.0)
RDW: 13.8 % (ref 11.5–15.5)

## 2012-06-17 LAB — RPR: RPR Ser Ql: NONREACTIVE

## 2012-06-17 SURGERY — Surgical Case
Anesthesia: Regional

## 2012-06-17 NOTE — Addendum Note (Signed)
Addendum  created 06/17/12 0801 by Jhonnie Garner, CRNA   Modules edited:Notes Section

## 2012-06-17 NOTE — Anesthesia Postprocedure Evaluation (Signed)
Anesthesia Post Note  Patient: Olivia Fry  Procedure(s) Performed: Procedure(s) (LRB): CESAREAN SECTION (N/A)  Anesthesia type: SAB  Patient location: Mother/Baby  Post pain: Pain level controlled  Post assessment: Post-op Vital signs reviewed  Last Vitals:  Filed Vitals:   06/17/12 0457  BP: 117/69  Pulse: 69  Temp: 36.7 C  Resp: 18    Post vital signs: Reviewed  Level of consciousness: awake  Complications: No apparent anesthesia complications

## 2012-06-17 NOTE — Progress Notes (Signed)
Subjective: Postpartum Day 1: Cesarean Delivery Patient reports Doing well, pain controlled.  Objective: Vital signs in last 24 hours: Filed Vitals:   06/16/12 2130 06/16/12 2230 06/16/12 2300 06/17/12 0457  BP: 131/83 123/63 110/67 117/69  Pulse: 58 62 66 69  Temp: 97.7 F (36.5 C) 98.2 F (36.8 C) 98.6 F (37 C) 98 F (36.7 C)  TempSrc:      Resp: 18 18 18 18   Height:   5\' 4"  (1.626 m)   Weight:   80.604 kg (177 lb 11.2 oz)   SpO2: 98% 91% 92% 95%    Physical Exam:  General: alert, cooperative and appears stated age 35: appropriate Uterine Fundus: firm Incision: healing well   Basename 06/17/12 0555 06/16/12 1730  HGB 11.4* 14.3  HCT 33.8* 41.5    Assessment/Plan: Status post Cesarean section. Doing well postoperatively.  Continue current care Desires Neonatal circ. R/B/A reviewed and wishes to proceed.  Nicholad Kautzman H. 06/17/2012, 8:56 AM

## 2012-06-17 NOTE — Anesthesia Postprocedure Evaluation (Signed)
Anesthesia Post Note  Patient: Olivia Fry  Procedure(s) Performed: Procedure(s) (LRB): CESAREAN SECTION (N/A)  Anesthesia type: Spinal  Patient location: PACU  Post pain: Pain level controlled  Post assessment: Post-op Vital signs reviewed  Last Vitals:  Filed Vitals:   06/16/12 2230  BP: 123/63  Pulse: 62  Temp: 36.8 C  Resp: 18    Post vital signs: Reviewed  Level of consciousness: awake  Complications: No apparent anesthesia complications

## 2012-06-17 NOTE — Progress Notes (Signed)
   Patient ID: Olivia Fry, female   DOB: [birth year], 36 y.o.   MRN: [id number]  Nursery states the baby has yet to void.  Circ can't be completed at this time

## 2012-06-18 MED ORDER — OXYCODONE-ACETAMINOPHEN 5-325 MG PO TABS: 1.0000 | ORAL_TABLET | ORAL | Status: AC | PRN

## 2012-06-18 MED ORDER — IBUPROFEN 600 MG PO TABS: 600.0000 mg | ORAL_TABLET | Freq: Four times a day (QID) | ORAL | Status: AC

## 2012-06-18 NOTE — Discharge Summary (Signed)
Obstetric Discharge Summary Reason for Admission: cesarean section Prenatal Procedures: none Intrapartum Procedures: cesarean: low cervical, transverse Postpartum Procedures: none Complications-Operative and Postpartum: none Hemoglobin  Date Value Range Status  06/17/2012 11.4* 12.0 - 15.0 g/dL Final     DELTA CHECK NOTED     REPEATED TO VERIFY     HCT  Date Value Range Status  06/17/2012 33.8* 36.0 - 46.0 % Final    Physical Exam:  General: alert and cooperative Lochia: appropriate Uterine Fundus: firm Incision: healing well, no significant drainage, no dehiscence, no significant erythema DVT Evaluation: No evidence of DVT seen on physical exam.  Discharge Diagnoses: Term Pregnancy-delivered  Discharge Information: Date: 06/18/2012 Activity: pelvic rest Diet: routine Medications: PNV, Ibuprofen and Percocet Condition: stable Instructions: refer to practice specific booklet Discharge to: home Follow-up Information    Follow up with Mickel Baas, MD in 4 weeks.   Contact information:   [address] [place] [address] [phone]          Newborn Data: Live born female  Birth Weight: 9 lb 2.2 oz (4145 g) APGAR: 8, 9  Home with mother.  Philip Aspen 06/18/2012, 1:53 PM

## 2013-10-13 ENCOUNTER — Other Ambulatory Visit: Payer: Self-pay

## 2014-10-09 ENCOUNTER — Encounter (HOSPITAL_COMMUNITY): Payer: Self-pay | Admitting: Obstetrics & Gynecology

## 2015-10-16 ENCOUNTER — Other Ambulatory Visit: Payer: Self-pay | Admitting: Obstetrics and Gynecology

## 2015-10-17 LAB — CYTOLOGY - PAP

## 2017-07-09 ENCOUNTER — Ambulatory Visit (INDEPENDENT_AMBULATORY_CARE_PROVIDER_SITE_OTHER): Payer: BC Managed Care – PPO | Admitting: Allergy and Immunology

## 2017-07-09 ENCOUNTER — Encounter: Payer: Self-pay | Admitting: Allergy and Immunology

## 2017-07-09 VITALS — BP 98/70 | HR 56 | Temp 98.4°F | Resp 12 | Ht 64.37 in | Wt 153.7 lb

## 2017-07-09 DIAGNOSIS — R062 Wheezing: Secondary | ICD-10-CM

## 2017-07-09 DIAGNOSIS — J3089 Other allergic rhinitis: Secondary | ICD-10-CM

## 2017-07-09 DIAGNOSIS — R053 Chronic cough: Secondary | ICD-10-CM

## 2017-07-09 DIAGNOSIS — R05 Cough: Secondary | ICD-10-CM

## 2017-07-09 MED ORDER — AZELASTINE HCL 0.1 % NA SOLN: NASAL | 5 refills | Status: AC

## 2017-07-09 MED ORDER — CARBINOXAMINE MALEATE 6 MG PO TABS: 6.0000 mg | ORAL_TABLET | Freq: Three times a day (TID) | ORAL | 3 refills | Status: DC | PRN

## 2017-07-09 MED ORDER — AZELASTINE HCL 0.15 % NA SOLN: 1.0000 | Freq: Two times a day (BID) | NASAL | 3 refills | Status: DC | PRN

## 2017-07-09 NOTE — Assessment & Plan Note (Signed)
The patient's history and physical examination suggest upper airway cough syndrome.  Spirometry today reveals normal ventilatory function. We will aggressively treat postnasal drainage and evaluate results.  Treatment plan as outlined above.  If the coughing persists or progresses despite this plan, we will evaluate further. 

## 2017-07-09 NOTE — Patient Instructions (Addendum)
Seasonal and perennial allergic rhinitis  Aeroallergen avoidance measures have been discussed and provided in written form.  A prescription has been provided for azelastine nasal spray, 1-2 sprays per nostril 2 times daily as needed. Proper nasal spray technique has been discussed and demonstrated.   Nasal saline lavage (NeilMed) has been recommended as needed and prior to medicated nasal sprays along with instructions for proper administration.  A prescription has been provided for RyVent (carbinoxamine maleate) 6mg  every 6-8 hours as needed.  If allergen avoidance measures and medications fail to adequately relieve symptoms, aeroallergen immunotherapy will be considered.  Cough, persistent The patient's history and physical examination suggest upper airway cough syndrome.  Spirometry today reveals normal ventilatory function. We will aggressively treat postnasal drainage and evaluate results.  Treatment plan as outlined above.  If the coughing persists or progresses despite this plan, we will evaluate further.   Return in about 2 months (around 09/08/2017), or if symptoms worsen or fail to improve.  Reducing Pollen Exposure  The American Academy of Allergy, Asthma and Immunology suggests the following steps to reduce your exposure to pollen during allergy seasons.    1. Do not hang sheets or clothing out to dry; pollen may collect on these items. 2. Do not mow lawns or spend time around freshly cut grass; mowing stirs up pollen. 3. Keep windows closed at night.  Keep car windows closed while driving. 4. Minimize morning activities outdoors, a time when pollen counts are usually at their highest. 5. Stay indoors as much as possible when pollen counts or humidity is high and on windy days when pollen tends to remain in the air longer. 6. Use air conditioning when possible.  Many air conditioners have filters that trap the pollen spores. 7. Use a HEPA room air filter to remove pollen  form the indoor air you breathe.   Control of House Dust Mite Allergen  House dust mites play a major role in allergic asthma and rhinitis.  They occur in environments with high humidity wherever human skin, the food for dust mites is found. High levels have been detected in dust obtained from mattresses, pillows, carpets, upholstered furniture, bed covers, clothes and soft toys.  The principal allergen of the house dust mite is found in its feces.  A gram of dust may contain 1,000 mites and 250,000 fecal particles.  Mite antigen is easily measured in the air during house cleaning activities.    1. Encase mattresses, including the box spring, and pillow, in an air tight cover.  Seal the zipper end of the encased mattresses with wide adhesive tape. 2. Wash the bedding in water of 130 degrees Farenheit weekly.  Avoid cotton comforters/quilts and flannel bedding: the most ideal bed covering is the dacron comforter. 3. Remove all upholstered furniture from the bedroom. 4. Remove carpets, carpet padding, rugs, and non-washable window drapes from the bedroom.  Wash drapes weekly or use plastic window coverings. 5. Remove all non-washable stuffed toys from the bedroom.  Wash stuffed toys weekly. 6. Have the room cleaned frequently with a vacuum cleaner and a damp dust-mop.  The patient should not be in a room which is being cleaned and should wait 1 hour after cleaning before going into the room. 7. Close and seal all heating outlets in the bedroom.  Otherwise, the room will become filled with dust-laden air.  An electric heater can be used to heat the room. Reduce indoor humidity to less than 50%.  Do not use a  humidifier.  Control of Dog or Cat Allergen  Avoidance is the best way to manage a dog or cat allergy. If you have a dog or cat and are allergic to dog or cats, consider removing the dog or cat from the home. If you have a dog or cat but don't want to find it a new home, or if your family wants  a pet even though someone in the household is allergic, here are some strategies that may help keep symptoms at bay:  1. Keep the pet out of your bedroom and restrict it to only a few rooms. Be advised that keeping the dog or cat in only one room will not limit the allergens to that room. 2. Don't pet, hug or kiss the dog or cat; if you do, wash your hands with soap and water. 3. High-efficiency particulate air (HEPA) cleaners run continuously in a bedroom or living room can reduce allergen levels over time. 4. Place electrostatic material sheet in the air inlet vent in the bedroom. 5. Regular use of a high-efficiency vacuum cleaner or a central vacuum can reduce allergen levels. 6. Giving your dog or cat a bath at least once a week can reduce airborne allergen.  Control of Mold Allergen  Mold and fungi can grow on a variety of surfaces provided certain temperature and moisture conditions exist.  Outdoor molds grow on plants, decaying vegetation and soil.  The major outdoor mold, Alternaria and Cladosporium, are found in very high numbers during hot and dry conditions.  Generally, a late Summer - Fall peak is seen for common outdoor fungal spores.  Rain will temporarily lower outdoor mold spore count, but counts rise rapidly when the rainy period ends.  The most important indoor molds are Aspergillus and Penicillium.  Dark, humid and poorly ventilated basements are ideal sites for mold growth.  The next most common sites of mold growth are the bathroom and the kitchen.  Outdoor MicrosoftMold Control 1. Use air conditioning and keep windows closed 2. Avoid exposure to decaying vegetation. 3. Avoid leaf raking. 4. Avoid grain handling. 5. Consider wearing a face mask if working in moldy areas.  Indoor Mold Control 1. Maintain humidity below 50%. 2. Clean washable surfaces with 5% bleach solution. 3. Remove sources e.g. Contaminated carpets.

## 2017-07-09 NOTE — Assessment & Plan Note (Deleted)
The patient's history and physical examination suggest upper airway cough syndrome.  Spirometry today reveals normal ventilatory function. We will aggressively treat postnasal drainage and evaluate results.  Treatment plan as outlined above.  If the coughing persists or progresses despite this plan, we will evaluate further. 

## 2017-07-09 NOTE — Progress Notes (Signed)
New Patient Note  RE: Olivia ScheuermannJennifer Fry MRN: [id number] DOB: Apr 05, 1976 Date of Office Visit: 07/09/2017  Referring provider: Kristie CowmanSchooler, Karen, MD Primary care provider: Kristie CowmanSchooler, Karen, MD  Chief Complaint: Cough   History of present illness: Olivia ScheuermannJennifer Fry is a 41 y.o. female seen today in consultation requested by Kristie CowmanKaren Schooler, MD. She reports that for many years she has experienced a persistent cough.  She has no history of asthma, however reports that she had walking pneumonia in December 2017 and bronchitis in February. The cough is typically dry but occasionally productive of mucus. She has a friend with similar symptoms who endued up having a food allergy so she is interested in having potential food triggers and environmental triggers assessed. She experiences thick postnasal drainage, nasal congestion, and occasional sinus pressure. No significant seasonal symptom variation has been noted nor have specific environmental triggers been identified. The cough seems to be worse when she lays down for bed at night.  She experiences occasional heartburn, which she treats with Tums, however she is unable to detect a clear correlation between the heartburn and cough.     Assessment and plan: Seasonal and perennial allergic rhinitis  Aeroallergen avoidance measures have been discussed and provided in written form.  A prescription has been provided for azelastine nasal spray, 1-2 sprays per nostril 2 times daily as needed. Proper nasal spray technique has been discussed and demonstrated.   Nasal saline lavage (NeilMed) has been recommended as needed and prior to medicated nasal sprays along with instructions for proper administration.  A prescription has been provided for RyVent (carbinoxamine maleate) 6mg  every 6-8 hours as needed.  If allergen avoidance measures and medications fail to adequately relieve symptoms, aeroallergen immunotherapy will be considered.  Cough,  persistent The patient's history and physical examination suggest upper airway cough syndrome.  Spirometry today reveals normal ventilatory function. We will aggressively treat postnasal drainage and evaluate results.  Treatment plan as outlined above.  If the coughing persists or progresses despite this plan, we will evaluate further.   Meds ordered this encounter  Medications  . Carbinoxamine Maleate (RYVENT) 6 MG TABS    Sig: Take 6 mg by mouth every 8 (eight) hours as needed.    Dispense:  120 tablet    Refill:  3  . Azelastine HCl 0.15 % SOLN    Sig: Place 1-2 sprays into the nose 2 (two) times daily as needed.    Dispense:  30 mL    Refill:  3    Diagnostics: Spirometry:  FVC was 4.35 L and FEV1 was 4.00 L (131% predicted).  Please see scanned spirometry results for details. Environmental skin testing: Positive to grass pollens, weed pollens, ragweed pollen, tree pollens, molds, cat hair, dog epithelia, and dust mite antigen. Food allergen skin testing:  Negative despite a positive histamine control.    Physical examination: Blood pressure 98/70, pulse (!) 56, temperature 98.4 F (36.9 C), temperature source Oral, resp. rate 12, height 5' 4.37" (1.635 m), weight 153 lb 10.6 oz (69.7 kg), SpO2 97 %, unknown if currently breastfeeding.  General: Alert, interactive, in no acute distress. HEENT: TMs pearly gray, turbinates moderately edematous without discharge, post-pharynx erythematous. Neck: Supple without lymphadenopathy. Lungs: Clear to auscultation without wheezing, rhonchi or rales. CV: Normal S1, S2 without murmurs. Abdomen: Nondistended, nontender. Skin: Warm and dry, without lesions or rashes. Extremities:  No clubbing, cyanosis or edema. Neuro:   Grossly intact.  Review of systems:  Review of systems negative except as  noted in HPI / PMHx or noted below: Review of Systems  Constitutional: Negative.   HENT: Negative.   Eyes: Negative.   Respiratory: Negative.    Cardiovascular: Negative.   Gastrointestinal: Negative.   Genitourinary: Negative.   Musculoskeletal: Negative.   Skin: Negative.   Neurological: Negative.   Endo/Heme/Allergies: Negative.   Psychiatric/Behavioral: Negative.     Past medical history:  Past Medical History:  Diagnosis Date  . Dysrhythmia    h/o rare episodes of brief increase in HR, only when pregnant per pt  . Fainting spell 2010   with 1st pregnancy- poss. related to episode of increased HR  . GERD (gastroesophageal reflux disease)     Past surgical history:  Past Surgical History:  Procedure Laterality Date  . CESAREAN SECTION    . CESAREAN SECTION  06/16/2012   Procedure: CESAREAN SECTION;  Surgeon: Mickel Baasichard D Kaplan, MD;  Location: [facility];  Service: Gynecology;  Laterality: N/A;    Family history: Family History  Problem Relation Age of Onset  . Asthma Mother   . Allergic rhinitis Mother   . Asthma Sister   . Allergic rhinitis Sister   . Eczema Son   . Angioedema Neg Hx   . Immunodeficiency Neg Hx   . Urticaria Neg Hx     Social history: Social History   Social History  . Marital status: Married    Spouse name: N/A  . Number of children: N/A  . Years of education: N/A   Occupational History  . Not on file.   Social History Main Topics  . Smoking status: Never Smoker  . Smokeless tobacco: Never Used  . Alcohol use No  . Drug use: No  . Sexual activity: Yes   Other Topics Concern  . Not on file   Social History Narrative  . No narrative on file   Environmental History: The patient lives in a 39 year old house with carpeting throughout, gas heat, and central air.  There is no known mold/water damage in the home.  She is a nonsmoker without pets.  Allergies as of 07/09/2017   No Known Allergies     Medication List       Accurate as of 07/09/17  4:43 PM. Always use your most recent med list.          albuterol 108 (90 Base) MCG/ACT inhaler Commonly known as:  PROVENTIL  HFA;VENTOLIN HFA Inhale into the lungs.   Azelastine HCl 0.15 % Soln Place 1-2 sprays into the nose 2 (two) times daily as needed.   Carbinoxamine Maleate 6 MG Tabs Commonly known as:  RYVENT Take 6 mg by mouth every 8 (eight) hours as needed.   MIRENA (52 MG) 20 MCG/24HR IUD Generic drug:  levonorgestrel 1 each by Intrauterine route once.   mometasone 50 MCG/ACT nasal spray Commonly known as:  NASONEX two sprays by Both Nostrils route daily.   montelukast 10 MG tablet Commonly known as:  SINGULAIR Take by mouth.   prenatal multivitamin Tabs tablet Take 1 tablet by mouth at bedtime.       Known medication allergies: No Known Allergies  I appreciate the opportunity to take part in Olivia Fry's care. Please do not hesitate to contact me with questions.  Sincerely,   R. Jorene Guestarter Miken Stecher, MD

## 2017-07-09 NOTE — Assessment & Plan Note (Signed)
   Aeroallergen avoidance measures have been discussed and provided in written form.  A prescription has been provided for azelastine nasal spray, 1-2 sprays per nostril 2 times daily as needed. Proper nasal spray technique has been discussed and demonstrated.   Nasal saline lavage (NeilMed) has been recommended as needed and prior to medicated nasal sprays along with instructions for proper administration.  A prescription has been provided for RyVent (carbinoxamine maleate) 6mg  every 6-8 hours as needed.  If allergen avoidance measures and medications fail to adequately relieve symptoms, aeroallergen immunotherapy will be considered.

## 2017-07-10 ENCOUNTER — Telehealth: Payer: Self-pay

## 2017-07-10 ENCOUNTER — Other Ambulatory Visit: Payer: Self-pay

## 2017-07-10 NOTE — Telephone Encounter (Signed)
Patient called back and I went over the Discount card for Ryvent to get it for $10. Patient stated that was fine. I called [facility] and spoke with Antoinette,gave her the codes for the Ryvent. Antoinette stated she would have to pay $26.00. Then I called the patient back and informed her that the pharmacist said it would cost $26. Patient stated that she could pay $26, it was better than 200. Patient is going to pick up Ryvent.

## 2017-07-10 NOTE — Telephone Encounter (Signed)
Called patient. Left message in regards to the Ryvent that was sent to the Pharmacy. We need to inform patient that the insurance will not cover Ryvent, and to find out if the patient has the discount code. Patient can inform pharmacy to apply the discount code and pay cash instead of using their insurance. The code for the patient to give to the pharmacy is: BIN# [id number]  RxPCN # [id number] Group# [id number] Person Code:01 Cardholder ID# [id number].

## 2017-07-10 NOTE — Telephone Encounter (Incomplete)
Patient called back and I informed her about the coupon card for Ryvent. She said that would be fine. I called

## 2017-07-13 ENCOUNTER — Telehealth: Payer: Self-pay

## 2017-07-13 NOTE — Telephone Encounter (Signed)
PA for ryvent was denied. What would you like to change it too?  Please advise

## 2017-07-13 NOTE — Telephone Encounter (Signed)
She has Nurse, learning disabilitycommercial insurance.  With coupon it should be $10 regardless.  Called the pharmaceutical representative to sort this out if does not the case.  Thanks.

## 2017-07-13 NOTE — Telephone Encounter (Signed)
lm for rep to call me back

## 2017-07-14 ENCOUNTER — Other Ambulatory Visit: Payer: Self-pay

## 2017-07-14 DIAGNOSIS — J3089 Other allergic rhinitis: Secondary | ICD-10-CM

## 2017-07-14 DIAGNOSIS — R053 Chronic cough: Secondary | ICD-10-CM

## 2017-07-14 DIAGNOSIS — R05 Cough: Secondary | ICD-10-CM

## 2017-07-14 MED ORDER — CARBINOXAMINE MALEATE 6 MG PO TABS: 6.0000 mg | ORAL_TABLET | Freq: Three times a day (TID) | ORAL | 3 refills | Status: AC | PRN

## 2017-09-23 ENCOUNTER — Ambulatory Visit (INDEPENDENT_AMBULATORY_CARE_PROVIDER_SITE_OTHER): Payer: BC Managed Care – PPO | Admitting: Allergy and Immunology

## 2017-09-23 ENCOUNTER — Encounter: Payer: Self-pay | Admitting: Allergy and Immunology

## 2017-09-23 VITALS — BP 108/72 | HR 67 | Temp 98.7°F | Resp 16

## 2017-09-23 DIAGNOSIS — J3089 Other allergic rhinitis: Secondary | ICD-10-CM | POA: Diagnosis not present

## 2017-09-23 DIAGNOSIS — R053 Chronic cough: Secondary | ICD-10-CM

## 2017-09-23 DIAGNOSIS — R05 Cough: Secondary | ICD-10-CM | POA: Diagnosis not present

## 2017-09-23 NOTE — Assessment & Plan Note (Signed)
Improved/resolved.  Treatment plan as outlined above for allergic rhinitis.

## 2017-09-23 NOTE — Progress Notes (Signed)
    Follow-up Note  RE: Olivia ScheuermannJennifer Fry MRN: [id number] DOB: March 06, 1976 Date of Office Visit: 09/23/2017  Primary care provider: Kristie CowmanSchooler, Karen, MD Referring provider: Kristie CowmanSchooler, Karen, MD  History of present illness: Olivia ScheuermannJennifer Fry is a 41 y.o. female with seasonal and perennial allergic rhinitis and history of persistent cough presenting today for follow up.  She was previously seen in this clinic for her initial evaluation on 07/09/2017. She reports that her cough has resolved.  She still experiences some occasional postnasal drainage and sneezing.  She is taking montelukast at bedtime and occasionally will take Nasonexas well, if needed.    Assessment and plan: Seasonal and perennial allergic rhinitis  Continue appropriate allergen avoidance measures, montelukast 10 mg daily bedtime, Nasonex as needed, nasal saline irrigation if needed, and RyVent if needed.  If allergen avoidance measures and medications fail to adequately relieve symptoms, aeroallergen immunotherapy will be considered.  Cough, persistent Improved/resolved.  Treatment plan as outlined above for allergic rhinitis.   Diagnositcs: Spirometry:  Normal with an FEV1 of or 4.23 L.  Please see scanned spirometry results for details.  Physical examination: Blood pressure 108/72, pulse 67, temperature 98.7 F (37.1 C), temperature source Oral, resp. rate 16, SpO2 98 %, unknown if currently breastfeeding.  General: Alert, interactive, in no acute distress. HEENT: TMs pearly gray, turbinates moderately edematous without discharge, post-pharynx unremarkable. Neck: Supple without lymphadenopathy. Lungs: Clear to auscultation without wheezing, rhonchi or rales. CV: Normal S1, S2 without murmurs. Skin: Warm and dry, without lesions or rashes.  The following portions of the patient's history were reviewed and updated as appropriate: allergies, current medications, past family history, past medical history, past  social history, past surgical history and problem list.  Allergies as of 09/23/2017   No Known Allergies     Medication List       Accurate as of 09/23/17  3:50 PM. Always use your most recent med list.          albuterol 108 (90 Base) MCG/ACT inhaler Commonly known as:  PROVENTIL HFA;VENTOLIN HFA Inhale into the lungs.   azelastine 0.1 % nasal spray Commonly known as:  ASTELIN 1-2 sprays per nostril 2 times daily as needed for runny nose.   Carbinoxamine Maleate 6 MG Tabs Commonly known as:  RYVENT Take 6 mg by mouth every 8 (eight) hours as needed.   MIRENA (52 MG) 20 MCG/24HR IUD Generic drug:  levonorgestrel 1 each by Intrauterine route once.   mometasone 50 MCG/ACT nasal spray Commonly known as:  NASONEX two sprays by Both Nostrils route daily.   montelukast 10 MG tablet Commonly known as:  SINGULAIR Take by mouth.       No Known Allergies  I appreciate the opportunity to take part in Ranetta's care. Please do not hesitate to contact me with questions.  Sincerely,   R. Jorene Guestarter Ethridge Sollenberger, MD

## 2017-09-23 NOTE — Patient Instructions (Signed)
Seasonal and perennial allergic rhinitis  Continue appropriate allergen avoidance measures, montelukast 10 mg daily bedtime, Nasonex as needed, nasal saline irrigation if needed, and RyVent if needed.  If allergen avoidance measures and medications fail to adequately relieve symptoms, aeroallergen immunotherapy will be considered.  Cough, persistent Improved/resolved.  Treatment plan as outlined above for allergic rhinitis.   Return in about 1 year (around 09/23/2018), or if symptoms worsen or fail to improve.

## 2017-09-23 NOTE — Assessment & Plan Note (Signed)
   Continue appropriate allergen avoidance measures, montelukast 10 mg daily bedtime, Nasonex as needed, nasal saline irrigation if needed, and RyVent if needed.  If allergen avoidance measures and medications fail to adequately relieve symptoms, aeroallergen immunotherapy will be considered.
